# Patient Record
Sex: Male | Born: 1945 | Race: White | Hispanic: No | Marital: Married | Smoking: Never smoker
Health system: Southern US, Community
[De-identification: ages and names within clinical notes are randomized; demographics above are authoritative.]

## PROBLEM LIST (undated history)

## (undated) DIAGNOSIS — I10 Essential (primary) hypertension: Secondary | ICD-10-CM

## (undated) DIAGNOSIS — H539 Unspecified visual disturbance: Secondary | ICD-10-CM

## (undated) DIAGNOSIS — E785 Hyperlipidemia, unspecified: Secondary | ICD-10-CM

## (undated) DIAGNOSIS — C4491 Basal cell carcinoma of skin, unspecified: Secondary | ICD-10-CM

## (undated) HISTORY — DX: Essential (primary) hypertension: I10

## (undated) HISTORY — PX: JOINT REPLACEMENT: SHX530

## (undated) HISTORY — DX: Hyperlipidemia, unspecified: E78.5

## (undated) HISTORY — DX: Basal cell carcinoma of skin, unspecified: C44.91

## (undated) HISTORY — DX: Unspecified visual disturbance: H53.9

## (undated) HISTORY — PX: KNEE SURGERY: SHX244

---

## 1995-08-07 ENCOUNTER — Ambulatory Visit: Admit: 1995-08-07 | Disposition: A | Discharge status: Home / Self Care | Payer: Self-pay | Admitting: Family Medicine

## 1996-01-16 ENCOUNTER — Ambulatory Visit: Admit: 1996-01-16 | Disposition: A | Discharge status: Home / Self Care | Payer: Self-pay

## 1996-12-16 ENCOUNTER — Ambulatory Visit: Admit: 1996-12-16 | Disposition: A | Discharge status: Home / Self Care | Payer: Self-pay | Admitting: Family Medicine

## 1997-09-23 ENCOUNTER — Ambulatory Visit: Admit: 1997-09-23 | Disposition: A | Discharge status: Home / Self Care | Payer: Self-pay

## 2001-07-24 ENCOUNTER — Ambulatory Visit
Admit: 2001-07-24 | Disposition: A | Discharge status: Home / Self Care | Payer: Self-pay | Admitting: Physical Medicine & Rehabilitation

## 2002-09-12 HISTORY — PX: HX KNEE REPLACMENT: SHX125

## 2006-01-03 ENCOUNTER — Ambulatory Visit
Admission: RE | Admit: 2006-01-03 | Disposition: A | Discharge status: Home / Self Care | Payer: Self-pay | Source: Ambulatory Visit | Admitting: Family Medicine

## 2006-04-06 ENCOUNTER — Ambulatory Visit
Admit: 2006-04-06 | Disposition: A | Discharge status: Home / Self Care | Payer: Self-pay | Source: Ambulatory Visit | Admitting: Orthopaedic Surgery

## 2006-06-28 ENCOUNTER — Inpatient Hospital Stay
Admission: RE | Admit: 2006-06-28 | Disposition: A | Discharge status: Home / Self Care | Payer: Self-pay | Source: Ambulatory Visit

## 2014-06-12 DIAGNOSIS — K5792 Diverticulitis of intestine, part unspecified, without perforation or abscess without bleeding: Secondary | ICD-10-CM

## 2014-06-12 HISTORY — DX: Diverticulitis of intestine, part unspecified, without perforation or abscess without bleeding: K57.92

## 2014-06-14 ENCOUNTER — Ambulatory Visit (INDEPENDENT_AMBULATORY_CARE_PROVIDER_SITE_OTHER): Payer: Medicare Other

## 2014-06-14 ENCOUNTER — Ambulatory Visit (INDEPENDENT_AMBULATORY_CARE_PROVIDER_SITE_OTHER): Payer: Medicare Other | Admitting: Family Medicine

## 2014-06-14 VITALS — BP 150/95 | HR 91 | Temp 98.0°F | Resp 17 | Ht 69.5 in | Wt 244.0 lb

## 2014-06-14 DIAGNOSIS — I10 Essential (primary) hypertension: Secondary | ICD-10-CM

## 2014-06-14 DIAGNOSIS — R1032 Left lower quadrant pain: Secondary | ICD-10-CM

## 2014-06-14 DIAGNOSIS — K5732 Diverticulitis of large intestine without perforation or abscess without bleeding: Secondary | ICD-10-CM

## 2014-06-14 LAB — POCT CBC
Granulocyte percent: 68.6 %G (ref 37–80)
HCT, POC: 55.9 % — AB (ref 43.5–53.7)
Hemoglobin: 18.8 g/dL — AB (ref 14.1–18.1)
Lymph, poc: 2.5 (ref 0.6–3.4)
MCH, POC: 30.5 pg (ref 27–31.2)
MCHC: 33.6 g/dL (ref 31.8–35.4)
MCV: 91 fL (ref 80–97)
MID (cbc): 0.4 (ref 0–0.9)
MPV: 7.5 fL (ref 0–99.8)
POC Granulocyte: 6.4 (ref 2–6.9)
POC LYMPH PERCENT: 26.7 % (ref 10–50)
POC MID %: 4.7 %M (ref 0–12)
Platelet Count, POC: 177 10*3/uL (ref 142–424)
RBC: 6.15 M/uL — AB (ref 4.69–6.13)
RDW, POC: 13.6 %
WBC: 9.4 10*3/uL (ref 4.6–10.2)

## 2014-06-14 LAB — COMPLETE METABOLIC PANEL WITH GFR
AST: 26 U/L (ref 0–37)
Albumin: 4.3 g/dL (ref 3.5–5.2)
BUN: 13 mg/dL (ref 6–23)
CO2: 23 mEq/L (ref 19–32)
Calcium: 9.2 mg/dL (ref 8.4–10.5)
Chloride: 105 mEq/L (ref 96–112)
Creat: 1.03 mg/dL (ref 0.50–1.35)
GFR, Est African American: 86 mL/min
GFR, Est Non African American: 74 mL/min
Glucose, Bld: 83 mg/dL (ref 70–99)
Potassium: 4.2 mEq/L (ref 3.5–5.3)
Total Protein: 6.7 g/dL (ref 6.0–8.3)

## 2014-06-14 LAB — COMPLETE METABOLIC PANEL WITHOUT GFR
ALT: 43 U/L (ref 0–53)
Alkaline Phosphatase: 75 U/L (ref 39–117)
Sodium: 139 meq/L (ref 135–145)
Total Bilirubin: 0.7 mg/dL (ref 0.2–1.2)

## 2014-06-14 LAB — POCT UA - MICROSCOPIC ONLY
Bacteria, U Microscopic: NEGATIVE
Casts, Ur, LPF, POC: NEGATIVE
Crystals, Ur, HPF, POC: NEGATIVE
Mucus, UA: NEGATIVE
RBC, urine, microscopic: NEGATIVE
Yeast, UA: NEGATIVE

## 2014-06-14 LAB — POCT URINALYSIS DIPSTICK
Bilirubin, UA: NEGATIVE
Blood, UA: NEGATIVE
Glucose, UA: NEGATIVE
Ketones, UA: NEGATIVE
Leukocytes, UA: NEGATIVE
Nitrite, UA: NEGATIVE
Protein, UA: NEGATIVE
Spec Grav, UA: 1.01
Urobilinogen, UA: 0.2
pH, UA: 6

## 2014-06-14 MED ORDER — METRONIDAZOLE 500 MG PO TABS: 500.0000 mg | ORAL_TABLET | Freq: Three times a day (TID) | ORAL | Status: AC

## 2014-06-14 MED ORDER — CIPROFLOXACIN HCL 500 MG PO TABS: 500.0000 mg | ORAL_TABLET | Freq: Two times a day (BID) | ORAL | Status: AC

## 2014-06-14 NOTE — Progress Notes (Signed)
Chief Complaint:  Chief Complaint  Patient presents with  . Diverticulitis    since thursday per patient     HPI: Joseph Park is a 68 y.o. male who is here for LLQ abd and flank pain ssimilar to  Diverticulitis sxs he has had before requiring hospitalization x 3 days, he had to go to ER last year for this   Because he waited too long and had more paina and also bloody stools He is here with hsi wife from [place] for a wedding, The wedding is this afternoon, he has LLQ abd pain, denies any nausea , vomiting, fevers, chill, blood in his  Urine or stool, has been able to eat and drink without any problems, denies any h/o kidney stones, [place] or crohns He has had no abd surgeries, has has BM today and also has been passing gas, + achey pain 3-4/10   History reviewed. No pertinent past medical history. Past Surgical History  Procedure Laterality Date  . Joint replacement     History   Social History  . Marital Status: Married    Spouse Name: N/A    Number of Children: N/A  . Years of Education: N/A   Social History Main Topics  . Smoking status: Never Smoker   . Smokeless tobacco: None  . Alcohol Use: No  . Drug Use: No  . Sexual Activity: No   Other Topics Concern  . None   Social History Narrative  . None   History reviewed. No pertinent family history. No Known Allergies Prior to Admission medications   Medication Sig Start Date End Date Taking? Authorizing Provider  atenolol (TENORMIN) 25 MG tablet Take by mouth daily.   Yes Historical Provider, MD  hydrochlorothiazide (HYDRODIURIL) 25 MG tablet Take 25 mg by mouth daily.   Yes Historical Provider, MD  rosuvastatin (CRESTOR) 40 MG tablet Take 40 mg by mouth daily.   Yes Historical Provider, MD     ROS: The patient denies fevers, chills, night sweats, unintentional weight loss, chest pain, palpitations, wheezing, dyspnea on exertion, nausea, vomiting, dysuria, hematuria, melena, numbness, weakness, or tingling.    All other systems have been reviewed and were otherwise negative with the exception of those mentioned in the HPI and as above.    PHYSICAL EXAM: Filed Vitals:   06/14/14 1012  BP: 150/95  Pulse: 91  Temp: 98 F (36.7 C)  Resp: 17   Filed Vitals:   06/14/14 1012  Height: 5' 9.5" (1.765 m)  Weight: 244 lb (110.678 kg)   Body mass index is 35.53 kg/(m^2).  General: Alert, no acute distress HEENT:  Normocephalic, atraumatic, oropharynx patent. EOMI, PERRLA, tm nl  Cardiovascular:  Regular rate and rhythm, no rubs murmurs or gallops.  No Carotid bruits, radial pulse intact. No pedal edema.  Respiratory: Clear to auscultation bilaterally.  No wheezes, rales, or rhonchi.  No cyanosis, no use of accessory musculature GI: No organomegaly, + LLQ abdomen tenderness, positive bowel sounds.  No masses. Soft , no peritoneal signs, no abd hernias Skin: No rashes. Neurologic: Facial musculature symmetric. Psychiatric: Patient is appropriate throughout our interaction. Lymphatic: No cervical lymphadenopathy Musculoskeletal: Gait intact.   LABS: Results for orders placed in visit on 06/14/14  POCT CBC      Result Value Ref Range   WBC 9.4  4.6 - 10.2 K/uL   Lymph, poc 2.5  0.6 - 3.4   POC LYMPH PERCENT 26.7  10 - 50 %L  MID (cbc) 0.4  0 - 0.9   POC MID % 4.7  0 - 12 %M   POC Granulocyte 6.4  2 - 6.9   Granulocyte percent 68.6  37 - 80 %G   RBC 6.15 (*) 4.69 - 6.13 M/uL   Hemoglobin 18.8 (*) 14.1 - 18.1 g/dL   HCT, POC 16.155.9 (*) 09.643.5 - 53.7 %   MCV 91.0  80 - 97 fL   MCH, POC 30.5  27 - 31.2 pg   MCHC 33.6  31.8 - 35.4 g/dL   RDW, POC 04.513.6     Platelet Count, POC 177  142 - 424 K/uL   MPV 7.5  0 - 99.8 fL  POCT URINALYSIS DIPSTICK      Result Value Ref Range   Color, UA yellow     Clarity, UA clear     Glucose, UA neg     Bilirubin, UA neg     Ketones, UA neg     Spec Grav, UA 1.010     Blood, UA neg     pH, UA 6.0     Protein, UA neg     Urobilinogen, UA 0.2      Nitrite, UA neg     Leukocytes, UA Negative    POCT UA - MICROSCOPIC ONLY      Result Value Ref Range   WBC, Ur, HPF, POC 0-2     RBC, urine, microscopic neg     Bacteria, U Microscopic neg     Mucus, UA neg     Epithelial cells, urine per micros 0-1     Crystals, Ur, HPF, POC neg     Casts, Ur, LPF, POC neg     Yeast, UA neg       EKG/XRAY:   [facility] Neg for acute cardiopulmonary issues, perhaps mild chronic bronchitis No effusion Abdomen no free air, or obvious obstruction   ASSESSMENT/PLAN: Encounter Diagnoses  Name Primary?  . Left lower quadrant pain   . Essential hypertension   . Diverticulitis of colon without hemorrhage Yes   Rx Flagyl TID and Cipro BID x 10 days Bland , liquid diet advance as tolerated F/u with PCP prn   Gross sideeffects, risk and benefits, and alternatives of medications d/w patient. Patient is aware that all medications have potential sideeffects and we are unable to predict every sideeffect or drug-drug interaction that may occur.  Shailen Thielen PHUONG, DO 06/14/2014 12:12 PM

## 2014-06-14 NOTE — Patient Instructions (Signed)
Diverticulosis Diverticulosis is the condition that develops when small pouches (diverticula) form in the wall of your colon. Your colon, or large intestine, is where water is absorbed and stool is formed. The pouches form when the inside layer of your colon pushes through weak spots in the outer layers of your colon. CAUSES  No one knows exactly what causes diverticulosis. RISK FACTORS  Being older than 50. Your risk for this condition increases with age. Diverticulosis is rare in people younger than 40 years. By age 80, almost everyone has it.  Eating a low-fiber diet.  Being frequently constipated.  Being overweight.  Not getting enough exercise.  Smoking.  Taking over-the-counter pain medicines, like aspirin and ibuprofen. SYMPTOMS  Most people with diverticulosis do not have symptoms. DIAGNOSIS  Because diverticulosis often has no symptoms, health care providers often discover the condition during an exam for other colon problems. In many cases, a health care provider will diagnose diverticulosis while using a flexible scope to examine the colon (colonoscopy). TREATMENT  If you have never developed an infection related to diverticulosis, you may not need treatment. If you have had an infection before, treatment may include:  Eating more fruits, vegetables, and grains.  Taking a fiber supplement.  Taking a live bacteria supplement (probiotic).  Taking medicine to relax your colon. HOME CARE INSTRUCTIONS   Drink at least 6-8 glasses of water each day to prevent constipation.  Try not to strain when you have a bowel movement.  Keep all follow-up appointments. If you have had an infection before:  Increase the fiber in your diet as directed by your health care provider or dietitian.  Take a dietary fiber supplement if your health care provider approves.  Only take medicines as directed by your health care provider. SEEK MEDICAL CARE IF:   You have abdominal  pain.  You have bloating.  You have cramps.  You have not gone to the bathroom in 3 days. SEEK IMMEDIATE MEDICAL CARE IF:   Your pain gets worse.  Yourbloating becomes very bad.  You have a fever or chills, and your symptoms suddenly get worse.  You begin vomiting.  You have bowel movements that are bloody or black. MAKE SURE YOU:  Understand these instructions.  Will watch your condition.  Will get help right away if you are not doing well or get worse. Document Released: 05/26/2004 Document Revised: 09/03/2013 Document Reviewed: 07/24/2013 Exit[employer] Patient Information 2015 [employer]. This information is not intended to replace advice given to you by your health care provider. Make sure you discuss any questions you have with your health care provider. Diverticulitis Diverticulitis is inflammation or infection of small pouches in your colon that form when you have a condition called diverticulosis. The pouches in your colon are called diverticula. Your colon, or large intestine, is where water is absorbed and stool is formed. Complications of diverticulitis can include:  Bleeding.  Severe infection.  Severe pain.  Perforation of your colon.  Obstruction of your colon. CAUSES  Diverticulitis is caused by bacteria. Diverticulitis happens when stool becomes trapped in diverticula. This allows bacteria to grow in the diverticula, which can lead to inflammation and infection. RISK FACTORS People with diverticulosis are at risk for diverticulitis. Eating a diet that does not include enough fiber from fruits and vegetables may make diverticulitis more likely to develop. SYMPTOMS  Symptoms of diverticulitis may include:  Abdominal pain and tenderness. The pain is normally located on the left side of the abdomen, but   may occur in other areas.  Fever and chills.  Bloating.  Cramping.  Nausea.  Vomiting.  Constipation.  Diarrhea.  Blood in your  stool. DIAGNOSIS  Your health care provider will ask you about your medical history and do a physical exam. You may need to have tests done because many medical conditions can cause the same symptoms as diverticulitis. Tests may include:  Blood tests.  Urine tests.  Imaging tests of the abdomen, including X-rays and CT scans. When your condition is under control, your health care provider may recommend that you have a colonoscopy. A colonoscopy can show how severe your diverticula are and whether something else is causing your symptoms. TREATMENT  Most cases of diverticulitis are mild and can be treated at home. Treatment may include:  Taking over-the-counter pain medicines.  Following a clear liquid diet.  Taking antibiotic medicines by mouth for 7-10 days. More severe cases may be treated at a hospital. Treatment may include:  Not eating or drinking.  Taking prescription pain medicine.  Receiving antibiotic medicines through an IV tube.  Receiving fluids and nutrition through an IV tube.  Surgery. HOME CARE INSTRUCTIONS   Follow your health care provider's instructions carefully.  Follow a full liquid diet or other diet as directed by your health care provider. After your symptoms improve, your health care provider may tell you to change your diet. He or she may recommend you eat a high-fiber diet. Fruits and vegetables are good sources of fiber. Fiber makes it easier to pass stool.  Take fiber supplements or probiotics as directed by your health care provider.  Only take medicines as directed by your health care provider.  Keep all your follow-up appointments. SEEK MEDICAL CARE IF:   Your pain does not improve.  You have a hard time eating food.  Your bowel movements do not return to normal. SEEK IMMEDIATE MEDICAL CARE IF:   Your pain becomes worse.  Your symptoms do not get better.  Your symptoms suddenly get worse.  You have a fever.  You have repeated  vomiting.  You have bloody or black, tarry stools. MAKE SURE YOU:   Understand these instructions.  Will watch your condition.  Will get help right away if you are not doing well or get worse. Document Released: 06/08/2005 Document Revised: 09/03/2013 Document Reviewed: 07/24/2013 ExitCare Patient Information 2015 [employer]. This information is not intended to replace advice given to you by your health care provider. Make sure you discuss any questions you have with your health care provider.  

## 2014-06-17 ENCOUNTER — Encounter: Payer: Self-pay | Admitting: Family Medicine

## 2015-07-01 ENCOUNTER — Telehealth: Payer: Self-pay

## 2015-07-02 ENCOUNTER — Ambulatory Visit: Admitting: Ophthalmology

## 2015-07-02 ENCOUNTER — Ambulatory Visit: Payer: Non-veteran care | Admitting: Anesthesiology

## 2015-07-02 ENCOUNTER — Ambulatory Visit
Admission: RE | Admit: 2015-07-02 | Discharge: 2015-07-02 | Disposition: A | Discharge status: Home / Self Care | Payer: Non-veteran care | Attending: Ophthalmology | Admitting: Ophthalmology

## 2015-07-02 ENCOUNTER — Encounter
Admission: RE | Disposition: A | Discharge status: Home / Self Care | Payer: Self-pay | Source: Home / Self Care | Attending: Ophthalmology

## 2015-07-02 DIAGNOSIS — I739 Peripheral vascular disease, unspecified: Secondary | ICD-10-CM | POA: Insufficient documentation

## 2015-07-02 DIAGNOSIS — H269 Unspecified cataract: Secondary | ICD-10-CM | POA: Insufficient documentation

## 2015-07-02 DIAGNOSIS — H348312 Tributary (branch) retinal vein occlusion, right eye, stable: Secondary | ICD-10-CM | POA: Insufficient documentation

## 2015-07-02 DIAGNOSIS — Z8249 Family history of ischemic heart disease and other diseases of the circulatory system: Secondary | ICD-10-CM | POA: Insufficient documentation

## 2015-07-02 DIAGNOSIS — H43811 Vitreous degeneration, right eye: Secondary | ICD-10-CM | POA: Insufficient documentation

## 2015-07-02 DIAGNOSIS — H35371 Puckering of macula, right eye: Secondary | ICD-10-CM | POA: Insufficient documentation

## 2015-07-02 DIAGNOSIS — H35033 Hypertensive retinopathy, bilateral: Secondary | ICD-10-CM | POA: Insufficient documentation

## 2015-07-02 DIAGNOSIS — H40013 Open angle with borderline findings, low risk, bilateral: Secondary | ICD-10-CM | POA: Insufficient documentation

## 2015-07-02 HISTORY — PX: VITRECTOMY, 25 GAUGE, MECHANICAL: SHX5707

## 2015-07-02 SURGERY — VITRECTOMY 25 GAUGE, MECHANICAL, PARS PLANA
Anesthesia: Anesthesia General | Site: Eye | Laterality: Right | Wound class: Clean

## 2015-07-02 MED ORDER — HYPROMELLOSE (GONIOSCOPIC) 2.5 % OP SOLN
OPHTHALMIC | Status: AC
Start: 2015-07-02 — End: ?
  Filled 2015-07-02: qty 15

## 2015-07-02 MED ORDER — MIDAZOLAM HCL 2 MG/2ML IJ SOLN
INTRAMUSCULAR | Status: DC | PRN
Start: 2015-07-02 — End: 2015-07-02
  Administered 2015-07-02 (×2): 1 mg via INTRAVENOUS

## 2015-07-02 MED ORDER — VH PROPOFOL INFUSION 10 MG/ML (WRAPPED)
INTRAVENOUS | Status: DC | PRN
Start: 2015-07-02 — End: 2015-07-02
  Administered 2015-07-02: 50 ug/kg/min via INTRAVENOUS

## 2015-07-02 MED ORDER — MOXIFLOXACIN HCL 0.5 % OP SOLN
1.0000 [drp] | Freq: Once | OPHTHALMIC | Status: AC
Start: 2015-07-02 — End: 2015-07-02
  Administered 2015-07-02: 1 [drp] via OPHTHALMIC
  Filled 2015-07-02: qty 3

## 2015-07-02 MED ORDER — LACTATED RINGERS IV SOLN
INTRAVENOUS | Status: DC
Start: 2015-07-02 — End: 2015-07-02

## 2015-07-02 MED ORDER — METHYLPREDNISOLONE ACETATE 40 MG/ML IJ SUSP
INTRAMUSCULAR | Status: DC | PRN
Start: 2015-07-02 — End: 2015-07-02
  Administered 2015-07-02: 40 mg

## 2015-07-02 MED ORDER — PROPOFOL 10 MG/ML IV EMUL (WRAP)
INTRAVENOUS | Status: DC | PRN
Start: 2015-07-02 — End: 2015-07-02
  Administered 2015-07-02: 20 mg via INTRAVENOUS
  Administered 2015-07-02: 30 mg via INTRAVENOUS
  Administered 2015-07-02: 20 mg via INTRAVENOUS

## 2015-07-02 MED ORDER — BUPIVACAINE HCL (PF) 0.75 % IJ SOLN
INTRAMUSCULAR | Status: DC | PRN
Start: 2015-07-02 — End: 2015-07-02
  Administered 2015-07-02: 5 mL via RETROBULBAR

## 2015-07-02 MED ORDER — EPINEPHRINE 1 MG/ML IJ SOLN
INTRAMUSCULAR | Status: AC
Start: 2015-07-02 — End: ?
  Filled 2015-07-02: qty 1

## 2015-07-02 MED ORDER — INDOCYANINE GREEN 25 MG IV SOLR
INTRAVENOUS | Status: DC | PRN
Start: 2015-07-02 — End: 2015-07-02
  Administered 2015-07-02: 25 mg via INTRAVENOUS

## 2015-07-02 MED ORDER — INDOCYANINE GREEN 25 MG IV SOLR
INTRAVENOUS | Status: AC
Start: 2015-07-02 — End: ?
  Filled 2015-07-02: qty 25

## 2015-07-02 MED ORDER — STERILE WATER FOR INJECTION IJ SOLN
INTRAMUSCULAR | Status: AC
Start: 2015-07-02 — End: ?
  Filled 2015-07-02: qty 10

## 2015-07-02 MED ORDER — BUPIVACAINE HCL (PF) 0.75 % IJ SOLN
INTRAMUSCULAR | Status: AC
Start: 2015-07-02 — End: ?
  Filled 2015-07-02: qty 10

## 2015-07-02 MED ORDER — STERILE WATER FOR INJECTION IJ SOLN
INTRAMUSCULAR | Status: DC | PRN
Start: 2015-07-02 — End: 2015-07-02
  Administered 2015-07-02: 5 mL

## 2015-07-02 MED ORDER — TOBRAMYCIN 0.3 % OP OINT
TOPICAL_OINTMENT | OPHTHALMIC | Status: DC | PRN
Start: 2015-07-02 — End: 2015-07-02
  Administered 2015-07-02: 1 g via OPHTHALMIC

## 2015-07-02 MED ORDER — MIDAZOLAM HCL 2 MG/2ML IJ SOLN
INTRAMUSCULAR | Status: AC
Start: 2015-07-02 — End: ?
  Filled 2015-07-02: qty 2

## 2015-07-02 MED ORDER — SODIUM CHLORIDE 0.9 % IV SOLN
INTRAVENOUS | Status: DC | PRN
Start: 2015-07-02 — End: 2015-07-02

## 2015-07-02 MED ORDER — CEFAZOLIN SODIUM 1 G IJ SOLR
INTRAMUSCULAR | Status: AC
Start: 2015-07-02 — End: ?
  Filled 2015-07-02: qty 1000

## 2015-07-02 MED ORDER — FENTANYL CITRATE (PF) 50 MCG/ML IJ SOLN (WRAP)
INTRAMUSCULAR | Status: DC | PRN
Start: 2015-07-02 — End: 2015-07-02
  Administered 2015-07-02: 50 ug via INTRAVENOUS

## 2015-07-02 MED ORDER — PROPOFOL 10 MG/ML IV EMUL (WRAP)
INTRAVENOUS | Status: AC
Start: 2015-07-02 — End: ?
  Filled 2015-07-02: qty 20

## 2015-07-02 MED ORDER — DEXTROSE 50 % IV SOLN
INTRAVENOUS | Status: AC
Start: 2015-07-02 — End: ?
  Filled 2015-07-02: qty 50

## 2015-07-02 MED ORDER — METHYLPREDNISOLONE ACETATE 40 MG/ML IJ SUSP
INTRAMUSCULAR | Status: AC
Start: 2015-07-02 — End: ?
  Filled 2015-07-02: qty 1

## 2015-07-02 MED ORDER — VH PHENYLEPHRINE 120 MCG/ML IV BOLUS (ANESTHESIA)
PREFILLED_SYRINGE | INTRAVENOUS | Status: AC
Start: 2015-07-02 — End: ?
  Filled 2015-07-02: qty 20

## 2015-07-02 MED ORDER — BSS IO SOLN
INTRAOCULAR | Status: DC | PRN
Start: 2015-07-02 — End: 2015-07-02
  Administered 2015-07-02: 15 mL via TOPICAL

## 2015-07-02 MED ORDER — TRIAMCINOLONE ACETONIDE 40 MG/ML IO SUSP
INTRAOCULAR | Status: AC
Start: 2015-07-02 — End: ?
  Filled 2015-07-02: qty 1

## 2015-07-02 MED ORDER — FENTANYL CITRATE (PF) 50 MCG/ML IJ SOLN (WRAP)
INTRAMUSCULAR | Status: AC
Start: 2015-07-02 — End: ?
  Filled 2015-07-02: qty 2

## 2015-07-02 MED ORDER — BSS PLUS IO SOLN
INTRAOCULAR | Status: DC | PRN
Start: 2015-07-02 — End: 2015-07-02
  Administered 2015-07-02: 500 mL via INTRAOCULAR

## 2015-07-02 MED ORDER — PLASMA-LYTE 148 IV SOLN
INTRAVENOUS | Status: DC | PRN
Start: 2015-07-02 — End: 2015-07-02
  Administered 2015-07-02: 500 mL

## 2015-07-02 MED ORDER — LIDOCAINE HCL (PF) 2 % IJ SOLN
INTRAMUSCULAR | Status: AC
Start: 2015-07-02 — End: ?
  Filled 2015-07-02: qty 10

## 2015-07-02 MED ORDER — ROCURONIUM BROMIDE 50 MG/5ML IV SOLN
INTRAVENOUS | Status: AC
Start: 2015-07-02 — End: ?
  Filled 2015-07-02: qty 5

## 2015-07-02 MED ORDER — BSS PLUS IO SOLN
INTRAOCULAR | Status: AC
Start: 2015-07-02 — End: ?
  Filled 2015-07-02: qty 500

## 2015-07-02 MED ORDER — TOBRAMYCIN 0.3 % OP OINT
TOPICAL_OINTMENT | OPHTHALMIC | Status: AC
Start: 2015-07-02 — End: ?
  Filled 2015-07-02: qty 3.5

## 2015-07-02 MED ORDER — BSS IO SOLN
INTRAOCULAR | Status: AC
Start: 2015-07-02 — End: ?
  Filled 2015-07-02: qty 15

## 2015-07-02 MED ORDER — PROPARACAINE HCL 0.5 % OP SOLN
1.0000 [drp] | Freq: Once | OPHTHALMIC | Status: AC
Start: 2015-07-02 — End: 2015-07-02
  Administered 2015-07-02: 1 [drp] via OPHTHALMIC
  Filled 2015-07-02: qty 15

## 2015-07-02 MED ORDER — HYPROMELLOSE (GONIOSCOPIC) 2.5 % OP SOLN
OPHTHALMIC | Status: DC | PRN
Start: 2015-07-02 — End: 2015-07-02
  Administered 2015-07-02: 5 mL via TOPICAL

## 2015-07-02 MED ORDER — PHENYLEPHRINE HCL 2.5 % OP SOLN
1.0000 [drp] | OPHTHALMIC | Status: AC
Start: 2015-07-02 — End: 2015-07-02
  Administered 2015-07-02 (×3): 1 [drp] via OPHTHALMIC
  Filled 2015-07-02: qty 2

## 2015-07-02 MED ORDER — CYCLOPENTOLATE HCL 1 % OP SOLN
1.0000 [drp] | OPHTHALMIC | Status: AC
Start: 2015-07-02 — End: 2015-07-02
  Administered 2015-07-02 (×3): 1 [drp] via OPHTHALMIC
  Filled 2015-07-02: qty 2

## 2015-07-02 MED ORDER — EPINEPHRINE HCL 0.1 MG/ML IJ SOSY
PREFILLED_SYRINGE | INTRAMUSCULAR | Status: DC | PRN
Start: 2015-07-02 — End: 2015-07-02
  Administered 2015-07-02: .3 mg

## 2015-07-02 SURGICAL SUPPLY — 16 items
CLEANER ANTI-FOG (Supply) ×2 IMPLANT
DRAPE U 76X120 ST  *FOR IYLAS* (Supply) ×2 IMPLANT
FORCEP ILM 25+ DSP TIP (Supply) ×1 IMPLANT
GLOVE BIOGEL UT PI SURG SZ 7.5 (Supply) ×4 IMPLANT
GOWN STD LG W/TOWEL REUSE (Supply) ×4 IMPLANT
LENS ASPHERIC MACULAR DSP-59D (Supply) ×1 IMPLANT
PACK RETINAL CDS810023I CS/3 (Supply) ×2 IMPLANT
PAD ARMBOARD 20 X 8 (Supply) ×2 IMPLANT
PAK VITRECTOMY KIT (NEW ONE) (Supply) ×2 IMPLANT
PROBE LASER 25GA FLEXIBLE TIP (Supply) IMPLANT
SCRAPER MEMBRANE CVD 25+ (Supply) ×1 IMPLANT
SPONGE GAUZE 4 X 4 12 PLY (Dressings) ×2 IMPLANT
STERISTRIP 1/2 IN X 4 IN (Supply) ×2 IMPLANT
SUT VICRYL 7-0 J576G (Supply) ×1 IMPLANT
TAPE MICROPORE 1 (PAPER) (Supply) ×2 IMPLANT
TOWEL (FAN-FOLD) 6/PK (Supply) ×4 IMPLANT

## 2015-07-02 NOTE — Discharge Instructions (Signed)
[facility]   Post-Operative Discharge Instruction    Please bring this instruction sheet and any eyedrops from the hospital with you to the office for your next post-operative visit.  Keep the patch overtop of the eye until you are examined tomorrow (DO NOT REMOVE). Continue all other home medications unless told otherwise.  Call the office if you have any questions or problems with the eye.    Follow up: You will see Dr. Carin Primrose tomorrow in the office as previously scheduled.    Symptoms of Concern:  If the following symptoms develop, please contact the office immediately: worsening of eye pain, severe headache, vomiting, or decreasing vision.    Medications: No eye drops will be used till AFTER your eye patch has been removed by Dr. Carin Primrose the next day. You can take Tylenol or Ibuprofen over the counter as needed for pain (as long as you do not have an allergy to these- contact Dr. Carin Primrose if you do)         Positioning:  Face Down for 3 FULL DAYS starting today    Arm Band: If you received a green arm band (indicates that you have Gas in the operated eye) at the time of surgery, please do not remove it until instructed by your physician. This is a warning to healthcare providers who may come across you in the coming weeks.    Activities:    Avoid strenuous activities: heavy lifting, straining, sudden movement till told otherwise.   You may watch TV but avoid reading in the first week.  You may shower or shampoo beginning 1 FULL DAY after surgery. Avoid getting water directly in the eye.  Dr. Carin Primrose will discuss with you the general plan for returning back to work and getting back to full activity levels.

## 2015-07-02 NOTE — H&P (Signed)
I have reviewed the H&P, examined the patient and there are no changes.

## 2015-07-02 NOTE — Anesthesia Postprocedure Evaluation (Signed)
Anesthesia Post Evaluation    Patient: Alan Hoover    Procedures performed: Procedure(s) with comments:  VITRECTOMY, 25 GAUGE, MECHANICAL - RIGHT PPV MP/ILM PEEL AIR OR GAS     Anesthesia type: MAC       Last vitals:   Filed Vitals:    07/02/15 1506   BP: 136/85   Pulse: 68   Temp: 36.9 C (98.4 F)   Resp: 16   SpO2: 97%       Patient discharged from [facility] after meeting discharge criteria.

## 2015-07-02 NOTE — Transfer of Care (Signed)
Anesthesia Transfer of Care Note    Patient: Alan Hoover    Last vitals:   Filed Vitals:    07/02/15 1054   BP: 149/87   Pulse: 86   Temp: 36.5 C (97.7 F)   Resp: 16   SpO2: 98%       Oxygen: Room Air     Mental Status:awake and alert     Airway: Natural    Cardiovascular Status:  stable

## 2015-07-02 NOTE — Anesthesia Preprocedure Evaluation (Signed)
Anesthesia Evaluation    AIRWAY    Mallampati: II    TM distance: >3 FB  Neck ROM: full  Mouth Opening:full   CARDIOVASCULAR    cardiovascular exam normal, regular and normal       DENTAL    no notable dental hx               PULMONARY    pulmonary exam normal and clear to auscultation     OTHER FINDINGS                                              Anesthesia Plan    ASA 2     general                     intravenous induction   Detailed anesthesia plan: general endotracheal        Post op pain management: per surgeon    informed consent obtained      pertinent labs reviewed

## 2015-07-02 NOTE — Op Note (Signed)
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 Risks, benefits, alternatives were discussed with the patient. In the preoperative holding area the operative eye was marked. The patient was then brought back to the operating room and placed under monitored anesthesia care.  The patient  received a retrobulbar block of .75% Marcaine and 2% xylocaine. The operative eye was then prepped and draped in the usual sterile fashion for ophthalmic surgery.    The microscope was brought into position. Standard 25 gauge instrumentation was used to create trocar-based sclerotomies posterior to the limbus. The infusion cannula trocar was verified to be in the vitreous cavity prior to attaching and turning on the infusion line. A core vitrectomy was performed.  A posterior vitreous detachment was already present. Vitrectomy was carried out 360 degrees and vitreous base was trimmed. Examination of the macula revealed a strong macular pucker with definite foveal distortion.    ICG dye in an appropriate dilution was instilled overtop the macula and allowed to sit for one minute. The excess was aspirated.  Under the macula Resight lens, the Membrane Scraper was used to create an edge of Internal Limiting Membrane in temporal macula. The 25 Gauge ILM forceps was then used to peel the internal limiting  membrane and overlying Epiretinal Membrane atraumatically. A nice region of epiretinal membrane clearance around the fovea was obtained. There were no iatrogenic breaks during the peel and the macula appeared much more relaxed and without traction.    The retina was inspected 360 degrees and found to have no tears or holes. An Air-Fluid Exchange was now performed. The operative eye was felt to have a moderate tactile tension. The trocars were pulled one at a time starting with the superior ones. Those that self-sealed were left alone.  Any leaking wounds were sealed with a 7-0 Vicryl suture. A subconjunctival injection of ancef and dexamethasone was given. The eye was patched over Tobradex ointment The patient tolerated the procedure well and was returned to the recovery room in excellent condition.  The patient received post operative instructions from Dr. Carin Primrose.  Estimated Blood Loss:   0 mL      Specimens:   none    Complications:   * No complications entered in OR log *      Signed by: Clarene Duke, MD

## 2015-07-06 ENCOUNTER — Encounter: Payer: Self-pay | Admitting: Ophthalmology

## 2016-04-13 IMAGING — CR DG ABDOMEN ACUTE W/ 1V CHEST
3 series · 3 of 3 positions shown · non-contrast
Comparison: None.

CLINICAL DATA: Left lower quadrant abdominal pain. History of
diverticulitis. Initial encounter.

EXAM:
ACUTE ABDOMEN SERIES (ABDOMEN 2 VIEW & CHEST 1 VIEW)

[PA]
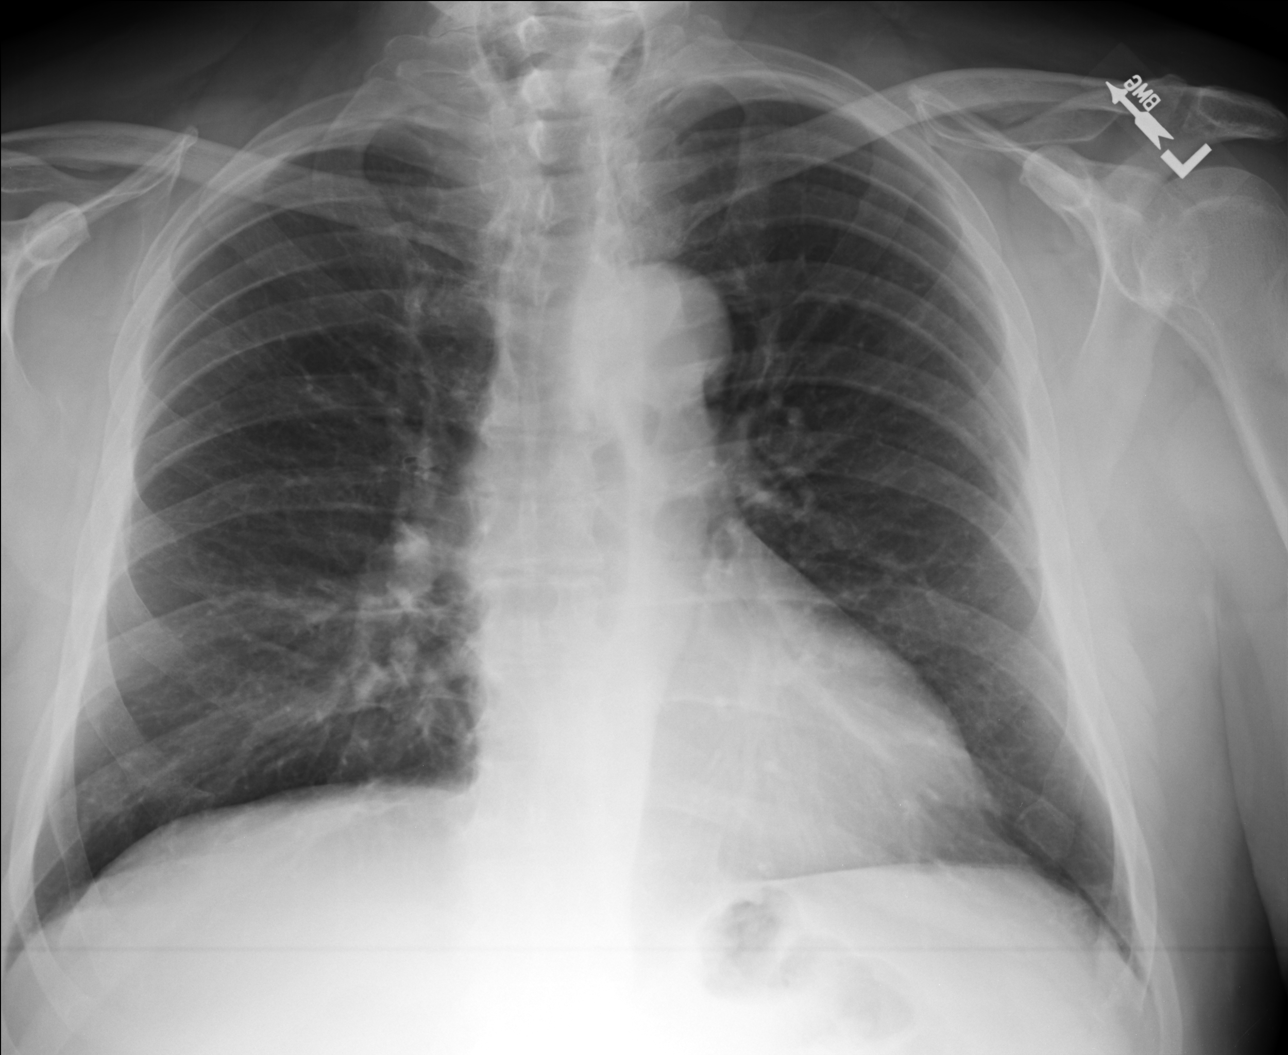

[AP (1 of 2)]
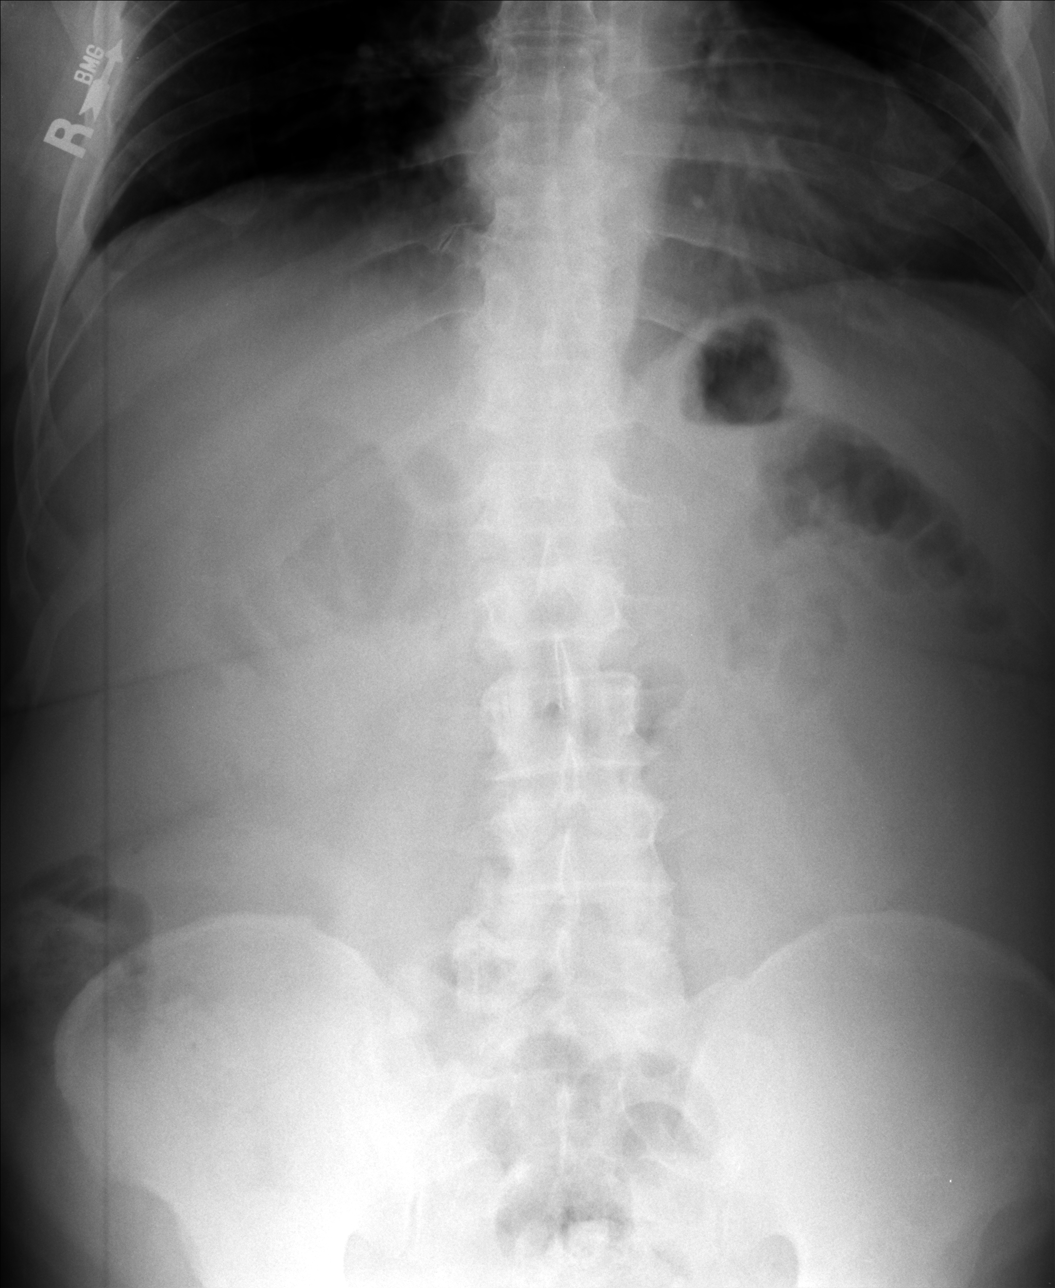

[AP (2 of 2)]
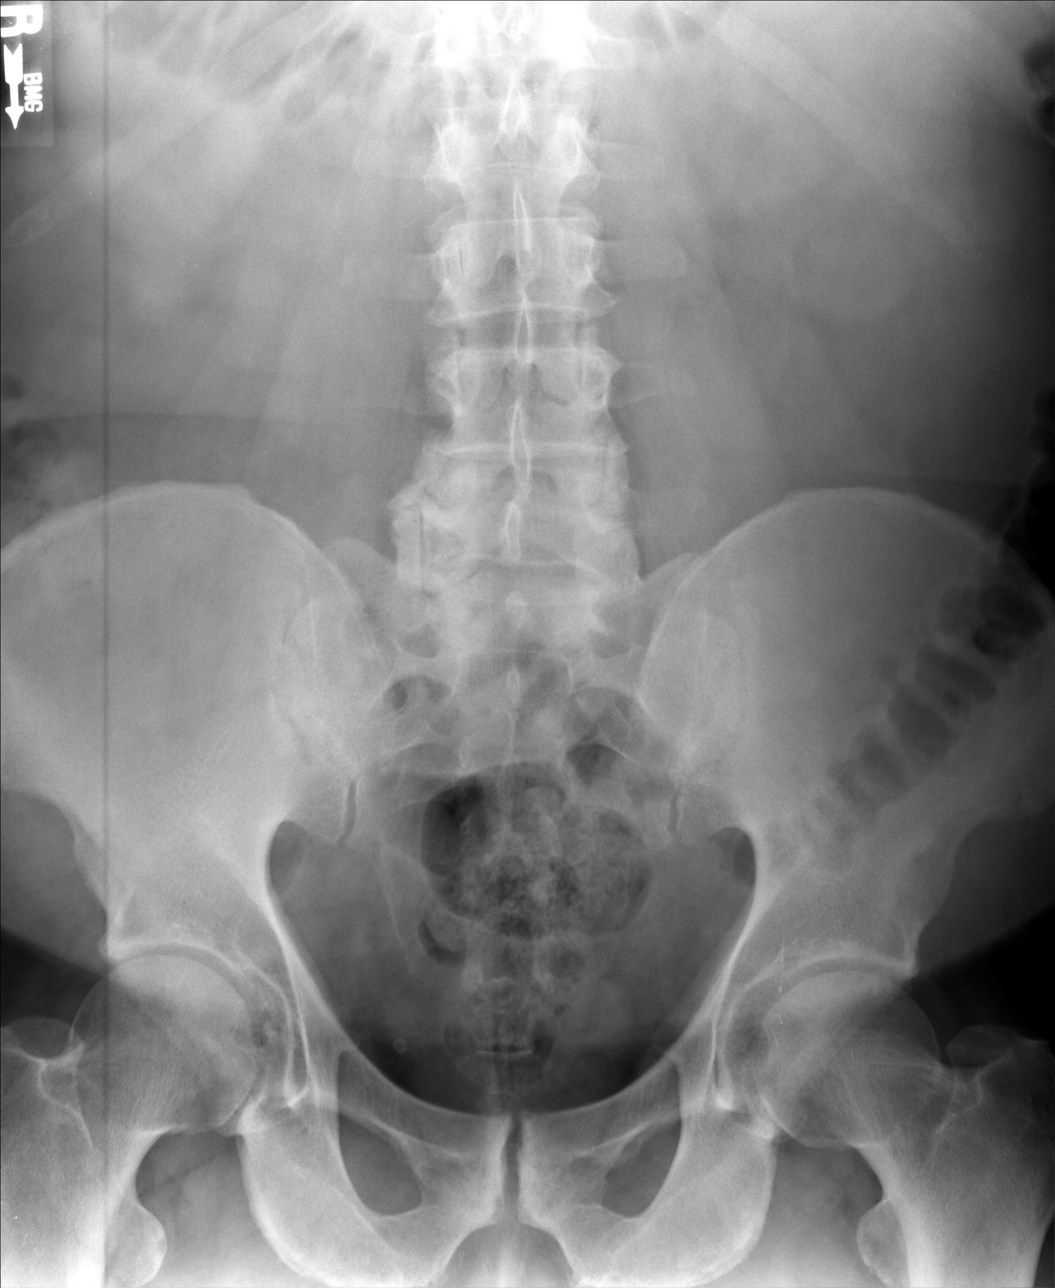

[3 of 3 positions shown; findings below may reference images not displayed]

FINDINGS: Borderline enlarged cardiac silhouette and mediastinal contours.
There is mild slightly nodular thickening of the pulmonary
interstitium. No discrete focal airspace opacities. No pleural
effusion or pneumothorax. No evidence of edema. No acute osseus
abnormalities.

Paucity of bowel gas without definite evidence of obstruction. No
pneumoperitoneum, pneumatosis or portal venous gas.

A prominent phlebolith overlies the right lower pelvis. Otherwise,
no definite abnormal intra-abdominal calcifications.

No acute osseus abnormalities. Bilateral facet degenerative change
of the lower lumbar spine is suspected though incompletely
evaluated.
IMPRESSION: 1. Borderline cardiomegaly and bronchitic change without acute
cardiopulmonary disease.
2. Paucity of bowel gas without evidence of obstruction.

## 2020-04-20 ENCOUNTER — Ambulatory Visit
Admission: RE | Admit: 2020-04-20 | Discharge: 2020-04-20 | Disposition: A | Discharge status: Home / Self Care | Payer: Self-pay | Source: Ambulatory Visit | Attending: Diagnostic Radiology | Admitting: Diagnostic Radiology

## 2020-04-20 ENCOUNTER — Encounter: Payer: Self-pay | Admitting: Diagnostic Radiology

## 2020-04-21 ENCOUNTER — Telehealth: Payer: Self-pay

## 2020-04-21 NOTE — Telephone Encounter (Signed)
[facility] C[facility] Request.

## 2020-04-23 ENCOUNTER — Encounter: Payer: Self-pay | Admitting: Orthopaedic Surgery

## 2020-04-23 DIAGNOSIS — M7121 Synovial cyst of popliteal space [Baker], right knee: Secondary | ICD-10-CM

## 2020-05-01 ENCOUNTER — Ambulatory Visit
Admission: RE | Admit: 2020-05-01 | Discharge: 2020-05-01 | Disposition: A | Discharge status: Home / Self Care | Payer: Non-veteran care | Source: Ambulatory Visit | Attending: Orthopaedic Surgery | Admitting: Orthopaedic Surgery

## 2020-05-01 DIAGNOSIS — M7121 Synovial cyst of popliteal space [Baker], right knee: Secondary | ICD-10-CM | POA: Insufficient documentation

## 2020-05-01 MED ORDER — LIDOCAINE HCL 1 % IJ SOLN
3.0000 mL | Freq: Once | INTRAMUSCULAR | Status: AC
Start: 2020-05-01 — End: 2020-05-01
  Administered 2020-05-01: 3 mL
  Filled 2020-05-01: qty 3

## 2020-05-01 MED ORDER — METHYLPREDNISOLONE ACETATE 40 MG/ML IJ SUSP
INTRAMUSCULAR | Status: AC
Start: 2020-05-01 — End: ?
  Filled 2020-05-01: qty 1

## 2020-05-01 MED ORDER — LIDOCAINE HCL (PF) 1 % IJ SOLN
INTRAMUSCULAR | Status: AC
Start: 2020-05-01 — End: ?
  Filled 2020-05-01: qty 5

## 2020-05-01 MED ORDER — METHYLPREDNISOLONE ACETATE 40 MG/ML IJ SUSP
40.0000 mg | Freq: Once | INTRAMUSCULAR | Status: AC
Start: 2020-05-01 — End: 2020-05-01
  Administered 2020-05-01: 40 mg via INTRA_ARTICULAR

## 2020-06-29 ENCOUNTER — Encounter: Payer: Self-pay | Admitting: Orthopaedic Surgery

## 2020-06-29 DIAGNOSIS — M7121 Synovial cyst of popliteal space [Baker], right knee: Secondary | ICD-10-CM

## 2020-07-03 ENCOUNTER — Ambulatory Visit
Admission: RE | Admit: 2020-07-03 | Discharge: 2020-07-03 | Disposition: A | Discharge status: Home / Self Care | Payer: Non-veteran care | Source: Ambulatory Visit | Attending: Orthopaedic Surgery | Admitting: Orthopaedic Surgery

## 2020-07-03 DIAGNOSIS — M7121 Synovial cyst of popliteal space [Baker], right knee: Secondary | ICD-10-CM | POA: Insufficient documentation

## 2020-07-03 MED ORDER — METHYLPREDNISOLONE ACETATE 40 MG/ML IJ SUSP
40.0000 mg | Freq: Once | INTRAMUSCULAR | Status: AC
Start: 2020-07-03 — End: 2020-07-03
  Administered 2020-07-03: 40 mg via INTRA_ARTICULAR

## 2020-07-03 MED ORDER — LIDOCAINE HCL 1 % IJ SOLN
3.0000 mL | Freq: Once | INTRAMUSCULAR | Status: AC
Start: 2020-07-03 — End: 2020-07-03
  Administered 2020-07-03: 3 mL
  Filled 2020-07-03: qty 3

## 2020-07-03 MED ORDER — METHYLPREDNISOLONE ACETATE 40 MG/ML IJ SUSP
INTRAMUSCULAR | Status: AC
Start: 2020-07-03 — End: ?
  Filled 2020-07-03: qty 1

## 2020-07-06 NOTE — Progress Notes (Signed)
How are you feeling? "I am doing good"      Was your pain controlled during your procedure? "yes"  Pain control after your procedure? "no way, my doctor prescribed me some anti inflammatories and that has helped alot  Pain currently? "no not at all"      Do you have any unusual bleeding/bruising? "no "      Were your discharge instructions given to you clearly? "yes they were"    Any additional comments? - All comments made during post call with pt

## 2020-08-31 ENCOUNTER — Encounter: Payer: Self-pay | Admitting: Orthopaedic Surgery

## 2020-08-31 DIAGNOSIS — M7121 Synovial cyst of popliteal space [Baker], right knee: Secondary | ICD-10-CM

## 2020-09-03 ENCOUNTER — Ambulatory Visit: Admission: RE | Admit: 2020-09-03 | Payer: Non-veteran care | Source: Ambulatory Visit

## 2020-09-03 ENCOUNTER — Ambulatory Visit
Admission: RE | Admit: 2020-09-03 | Discharge: 2020-09-03 | Disposition: A | Discharge status: Home / Self Care | Payer: Non-veteran care | Source: Ambulatory Visit | Attending: Orthopaedic Surgery | Admitting: Orthopaedic Surgery

## 2020-09-03 ENCOUNTER — Encounter: Payer: Self-pay | Admitting: Orthopaedic Surgery

## 2020-09-03 DIAGNOSIS — M7121 Synovial cyst of popliteal space [Baker], right knee: Secondary | ICD-10-CM | POA: Insufficient documentation

## 2020-09-03 DIAGNOSIS — M7120 Synovial cyst of popliteal space [Baker], unspecified knee: Secondary | ICD-10-CM

## 2020-09-03 MED ORDER — METHYLPREDNISOLONE ACETATE 40 MG/ML IJ SUSP
INTRAMUSCULAR | Status: AC
Start: 2020-09-03 — End: ?
  Filled 2020-09-03: qty 1

## 2020-09-03 MED ORDER — METHYLPREDNISOLONE ACETATE 40 MG/ML IJ SUSP
40.0000 mg | Freq: Once | INTRAMUSCULAR | Status: AC
Start: 2020-09-03 — End: 2020-09-03
  Administered 2020-09-03: 40 mg via INTRA_ARTICULAR

## 2020-09-03 MED ORDER — ROPIVACAINE HCL 5 MG/ML IJ SOLN
INTRAMUSCULAR | Status: AC
Start: 2020-09-03 — End: ?
  Filled 2020-09-03: qty 30

## 2020-09-03 MED ORDER — ROPIVACAINE HCL 5 MG/ML IJ SOLN
4.0000 mL | Freq: Once | INTRAMUSCULAR | Status: AC
Start: 2020-09-03 — End: 2020-09-03
  Administered 2020-09-03: 4 mL

## 2020-09-03 NOTE — Nursing Progress Note (Signed)
Discharge instructions reviewed with the patient by this RN. Patient verbalizes understanding and has no questions at this time. Paper copies provided.

## 2020-09-03 NOTE — Discharge Instructions (Signed)
Department of Radiology  General Post Biopsy  Patient Discharge Instructions    Instructions:  . The results of your test will be sent to the doctor who referred you here for the procedure.  This usually occurs within 2-3 days.  . The biopsy site will heal in a few days. You may take a shower but no tub baths, pool activity, hot tub, or Jacuzzi.  May remove gauze dressing in 24 hours and leave open to air.  Cleanse with soap and water daily.    . If the biopsy site becomes red, has drainage, or you develop a fever, contact your doctor.  . Do NOT take Aspirin, ibuprofen, naprosyn, or other NSAIDs  for 1 week following procedure, unless otherwise directed by your doctor.  . No strenuous activity or heavy lifting for *** days  . You may resume your usual diet today  . You may return to work tomorrow if you have a desk job OR *** days if you have a strenuous job.  . Your IV site may feel sore for 24 hours.  You can place cool compresses for comfort at site for first 24 hrs and then heat as needed after that.    Instructions due to Conscious Sedation:  . Do NOT participate in activities where you could become injured for the next 24 hours or until you feel normal again.  For example do not:  drive, operate heavy machinery, cook, or use power tools.  . Do not make important decisions or sign legal documents for the next 24 hours.  . Only take over-the-counter or prescription medications for pain, discomfort or fever, as directed.  If pain medications have been prescribed for you, ask your healthcare team how soon it is safe to take.  . You should not drink alcohol, take sleeping pills, or medications that cause drowsiness for 24 hours.    Seek medical attention for the following:  . Develop a fever greater than 100 F or chills  . Increased or continued pain from the biopsy site  . Redness, swelling, warmth, or bleeding or other drainage from the site  . Light headedness or dizziness  . Nausea and /or  vomiting  . Shortness of breath or difficulty breathing    If you have any questions, please call your doctor or the Radiology Department at [phone].

## 2020-11-19 ENCOUNTER — Encounter: Payer: Self-pay | Admitting: Physician Assistant

## 2020-11-19 DIAGNOSIS — M7121 Synovial cyst of popliteal space [Baker], right knee: Secondary | ICD-10-CM

## 2020-11-24 ENCOUNTER — Ambulatory Visit
Admission: RE | Admit: 2020-11-24 | Discharge: 2020-11-24 | Disposition: A | Discharge status: Home / Self Care | Payer: Non-veteran care | Source: Ambulatory Visit | Attending: Physician Assistant | Admitting: Physician Assistant

## 2020-11-24 DIAGNOSIS — M7121 Synovial cyst of popliteal space [Baker], right knee: Secondary | ICD-10-CM | POA: Insufficient documentation

## 2020-11-24 MED ORDER — METHYLPREDNISOLONE ACETATE 40 MG/ML IJ SUSP
INTRAMUSCULAR | Status: AC
Start: 2020-11-24 — End: ?
  Filled 2020-11-24: qty 1

## 2020-11-24 MED ORDER — ROPIVACAINE HCL 5 MG/ML IJ SOLN
INTRAMUSCULAR | Status: AC
Start: 2020-11-24 — End: ?
  Filled 2020-11-24: qty 30

## 2020-11-24 MED ORDER — ROPIVACAINE HCL 5 MG/ML IJ SOLN
1.0000 mL | Freq: Once | INTRAMUSCULAR | Status: AC
Start: 2020-11-24 — End: 2020-11-24
  Administered 2020-11-24: 1 mL

## 2020-11-24 MED ORDER — METHYLPREDNISOLONE ACETATE 40 MG/ML IJ SUSP
40.0000 mg | Freq: Once | INTRAMUSCULAR | Status: AC
Start: 2020-11-24 — End: 2020-11-24
  Administered 2020-11-24: 40 mg via INTRA_ARTICULAR

## 2020-11-24 NOTE — Progress Notes (Signed)
Discharge instructions verbally reviewed with patient, he verbalized understanding and denied any further questions, printed copy declined by patient.

## 2021-01-21 ENCOUNTER — Encounter: Payer: Self-pay | Admitting: Physician Assistant

## 2021-01-21 DIAGNOSIS — M7121 Synovial cyst of popliteal space [Baker], right knee: Secondary | ICD-10-CM

## 2021-02-01 ENCOUNTER — Ambulatory Visit
Admission: RE | Admit: 2021-02-01 | Discharge: 2021-02-01 | Disposition: A | Discharge status: Home / Self Care | Payer: Non-veteran care | Source: Ambulatory Visit | Attending: Physician Assistant | Admitting: Physician Assistant

## 2021-02-01 MED ORDER — METHYLPREDNISOLONE ACETATE 40 MG/ML IJ SUSP
INTRAMUSCULAR | Status: AC
Start: 2021-02-01 — End: ?
  Filled 2021-02-01: qty 1

## 2021-02-18 DIAGNOSIS — M545 Low back pain, unspecified: Secondary | ICD-10-CM

## 2021-02-23 ENCOUNTER — Ambulatory Visit
Admission: RE | Admit: 2021-02-23 | Discharge: 2021-02-23 | Disposition: A | Discharge status: Home / Self Care | Payer: Non-veteran care | Source: Ambulatory Visit | Attending: Neurology | Admitting: Neurology

## 2021-02-23 DIAGNOSIS — M419 Scoliosis, unspecified: Secondary | ICD-10-CM | POA: Insufficient documentation

## 2021-02-23 DIAGNOSIS — N281 Cyst of kidney, acquired: Secondary | ICD-10-CM | POA: Insufficient documentation

## 2021-02-23 DIAGNOSIS — M545 Low back pain, unspecified: Secondary | ICD-10-CM | POA: Insufficient documentation

## 2021-02-23 DIAGNOSIS — M4316 Spondylolisthesis, lumbar region: Secondary | ICD-10-CM | POA: Insufficient documentation

## 2021-02-23 DIAGNOSIS — M48061 Spinal stenosis, lumbar region without neurogenic claudication: Secondary | ICD-10-CM | POA: Insufficient documentation

## 2021-03-23 ENCOUNTER — Other Ambulatory Visit: Payer: Self-pay

## 2021-03-23 ENCOUNTER — Ambulatory Visit (INDEPENDENT_AMBULATORY_CARE_PROVIDER_SITE_OTHER): Payer: Medicare (Managed Care) | Admitting: Family Medicine

## 2021-03-23 ENCOUNTER — Ambulatory Visit: Payer: Medicare (Managed Care) | Attending: Family Medicine | Admitting: Family Medicine

## 2021-03-23 ENCOUNTER — Encounter (INDEPENDENT_AMBULATORY_CARE_PROVIDER_SITE_OTHER): Payer: Self-pay | Admitting: Family Medicine

## 2021-03-23 VITALS — BP 153/93 | HR 126 | Temp 103.0°F | Ht 70.0 in | Wt 245.0 lb

## 2021-03-23 DIAGNOSIS — R059 Cough, unspecified: Secondary | ICD-10-CM

## 2021-03-23 DIAGNOSIS — U071 COVID-19: Secondary | ICD-10-CM

## 2021-03-23 DIAGNOSIS — R509 Fever, unspecified: Secondary | ICD-10-CM

## 2021-03-23 LAB — POC COVID-19, FLU A/B, RSV RAPID BY PCR (RESULTS)
INFLUENZA VIRUS A, PCR 4PLEX, POC: NEGATIVE
INFLUENZA VIRUS B, PCR 4PLEX, POC: NEGATIVE
RSV, PCR 4PLEX, POC: NEGATIVE
SARS-COV-2, POC: POSITIVE — AB

## 2021-03-23 MED ORDER — MOLNUPIRAVIR 200 MG CAPSULE (EUA)
ORAL_CAPSULE | ORAL | 0 refills | Status: AC
Start: 2021-03-23 — End: ?

## 2021-03-23 NOTE — Addendum Note (Signed)
Addended by: Audree Camel on: 03/23/2021 11:48 AM     Modules accepted: Level of Service

## 2021-03-23 NOTE — Patient Instructions (Signed)
Coronavirus Disease 2019 (COVID-19): Overview  Coronavirus disease 2019 (COVID-19) is an illness that infects the lungs. It's caused by a type of coronavirus called SARS-CoV-2. There are many types of coronaviruses. They are a very common cause of colds and bronchitis. They can cause a lung infection called pneumonia. Symptoms can range from mild to severe. Some people have no symptoms. These viruses are also found in some animals.   The virus that causes COVID-19 changes (mutates) all the time. This is what all viruses do. It leads to different versions of a virus. These are called variants. COVID-19 variants may spread more easily from person to person. They may cause milder or more severe symptoms.   How the virus spreads is not yet fully known. It seems to spread and infect people easily. Some people may not know how they were infected. The virus may spread through droplets of fluid that a person coughs or sneezes into the air. It may be spread if you touch a surface with the virus on it and then touch your eyes, nose, or mouth. Handles, knobs, and objects may have the virus on them.      To help prevent spreading the infection, wash your hands often, or use an alcohol-based hand sanitizer.   For the latest information from the CDC:   Go to the [facility] website   Call 800-CDC-INFO 804-064-8596)  What are the symptoms of COVID-19?  Some people have no symptoms. Some have mild symptoms. And other people may have severe symptoms. Types of symptoms can vary from person to person. They may appear 2 to 14 days after contact with the virus. They can include:    Fever   Chills   Coughing   Trouble breathing or feeling short of breath   Sore throat   Stuffy or runny nose   Headache   Body aches   Tiredness   Nausea, vomiting, diarrhea, or abdominal pain   New loss of sense of smell or taste  Check your symptoms with the CDC's Coronavirus Self-Checker.   What are possible complications of [id number]-95?  The virus  can cause an infection in both lungs called pneumonia. In some cases, this can lead to death. Experts are still learning more about LIYIY-26 complications. More may be linked to the virus over time.   Some people are at higher risk for complications. This includes:    Older adults   People with heart or lung disease   People with diabetes or kidney disease   People with health conditions that suppress the immune system   People who take medicines that suppress the immune system  Rarely, a child may have a severe complication. This is called multisystem inflammatory syndrome in children (MIS-C). MIS-C seems to be like Kawasaki disease. This is a rare illness that causes inflammation of blood vessels and body organs. It's not yet known if MIS-C happens only in children. Experts don't know if adults are also at risk. It's also not known if it's related to COVID-19. Many children with MIS-C have tested positive for COVID-19. But not all have tested positive. Experts continue to study MIS-C.   How is COVID-19 diagnosed?  Your healthcare provider will ask:   What symptoms you have   Where you live   If you've traveled recently   If you've had contact with sick people  You may have 1 of these tests for COVID-19:   Viral (molecular) test. You may also hear this called an RT-PCR  test. Viral tests are very accurate. A viral test looks for the DNA of the SARS-CoV-2 virus. A viral test can also find COVID-19 variants. There are a few ways to do this. A swab may be wiped inside your nose or throat. Or a long swab may be put into your nose down to the back of your throat. Or a sample of your saliva may be taken. Your test results may be back in about 30 minutes. This depends on the type of test. Some tests must be sent to a lab. These can take several days for the results. Home test kits are now available. Some of these need a prescription. If you use a home kit, follow the instructions in the kit closely. Some kits  show results quickly at home. Others must be sent to a lab for the results.   Antigen test. This can find proteins from the SARS-CoV-2 virus. A swab may be wiped inside your nose or throat. Or a long swab may be put into your nose down to the back of your throat. Some results are back within 1 hour. This depends on the type of test. Positive results are very accurate. But false positive results can happen. This is more common in places where not many people have the virus. Antigen tests are more likely to miss a COVID-19 infection than a viral (molecular) test. If your antigen test is negative but you have symptoms of COVID-19, you may need to have a viral test.  If your provider thinks or confirms that you have COVID-19, you may have other tests. These tests may include:    Antibody blood test. This type of test can show if you were infected with the virus in the past. It shows antibodies in the blood that give some immunity. The accuracy and availability of these tests vary. An antibody test may not be able to show if you have an infection right now. This is because it can take up to a few weeks for your body to make antibodies.   Sputum culture. If you have a wet cough, you may be asked to cough up a small sample of mucus (sputum) from your lungs. This is tested for the virus. It may be tested for pneumonia.   Imaging tests. You may have a chest X-ray or CT scan.  Can you get COVID-19 again?  At this time, it's not clear if people can get COVID-19 more than once. The CDC notes that if a person has recovered from COVID-19 and is tested again within 3 months, they may still have low levels of the virus in their body. This means they test positive for COVID-19, but are not spreading it. Experts just don't yet know how long immunity lasts after you have the virus. They don't know if you can get COVID-19 again.   Vaccines for COVID-19  The FDA has approved several vaccines to help prevent COVID-19. Getting a  vaccine is important. It can help to prevent the spread of COVID-19 and its variants. It can help reduce the severity of COVID-19 if you get it. This can stop you from getting seriously ill. It can keep you from needing to go to the hospital. One vaccine has been approved for people as young as 30. Pregnant or breastfeeding people can be vaccinated. Ask your healthcare provider which vaccine may be best for you.   The vaccines are given as a shot (injection). This is most often done in a muscle in  the upper arm. There are 1-dose and 2-dose vaccines. For the 2-dose vaccine, the second dose is given several weeks after the first.   How is COVID-19 treated?  The most proven treatments right now are those to help your body while it fights the virus. This is known as supportive care. It includes:    Getting rest. This helps your body fight the illness.   Drinking fluids. Try to drink 6 to 8 glasses of fluids every day. Ask your provider which drinks are best for you. Don't have drinks with caffeine or alcohol.   Taking over-the-counter (OTC) medicine.  These are used to help ease pain and reduce fever. Ask your provider which OTC medicine is safe for you to use.  For severe illness, you may need to stay in the hospital. Your care may include:    IV (intravenous) fluids. These are given through a vein. This helps to replace fluids in your body.   Oxygen. You may be given supplemental oxygen. Or you may be put on a breathing machine (ventilator). This is done so you get enough oxygen in your body.   Prone positioning. Your healthcare team may regularly turn you on your stomach. This is called prone positioning. It helps increase the amount of oxygen you get to your lungs. Follow their instructions on position changes while you're in the hospital. Also follow their advice on the best positions to help your breathing once you go home.   Remdesivir. This is an antiviral medicine. The FDA has approved it for use on  COVID-19. It works by stopping the spread of the SARS-CoV-2 virus in the body. It's approved only for people who are in the hospital. It's for people 12 years and older who weigh at least 88 pounds (40 kgs). In some cases, it may also be used for people younger than 12 years or who weigh less than 88 pounds (40 kgs).  Experts are researching other types of treatment. These are still being tested. They are not widely used. These include:    COVID-19 convalescent plasma. Plasma is the liquid part of blood. People who had COVID-19 may be asked to donate plasma. This is called COVID-19 convalescent plasma donation. The plasma may have antibodies that can help fight COVID-19 in people who are very ill with it. Experts don't know how well the plasma will work. They continue to research it. The FDA has approved it for emergency use in some people with severe COVID-19. Ask your provider if you qualify to donate.   Monoclonal antibody therapy. The FDA approved this for emergency use in some people. They must have a positive COVID-19 viral test and have mild to moderate symptoms. They can't be in the hospital. It's approved for people 12 years and older who weigh at least 88 pounds (40 kgs) and are at high risk for severe COVID-19 and a hospital stay. This includes people who are 65 years and older and people with some chronic conditions. This therapy is not approved for people who are in the hospital with COVID-19 or need oxygen. Your healthcare team will tell you if you qualify.  Are you at risk for COVID-19?  You are at risk for COVID-19 if any of these apply to you:   You live in an area with cases of COVID-19   You traveled to an area with cases of COVID-19   You had close contact with someone who had COVID-19  Close contact means being within 6 feet.  This contact happens for 15 minutes or more. It may be short times of contact that add up to at least 15 minutes over a 24-hour period.   Keep in mind that COVID-19  may be spread by people who do not show symptoms.   Last updated: 03/12/2020   StayWell last reviewed this educational content on 09/12/2018   2000-2021 [employer]. All rights reserved. This information is not intended as a substitute for professional medical care. Always follow your healthcare professional's instructions.        [facility]  [address], [place]  [place], [place] [address]  Phone: [phone]  Fax: [phone]             Open Daily 8:00am - 8:00pm, except Sundays 12pm-8pm         ~ Closed Thanksgiving and Christmas Day     Attending Caregiver: Maura Crandall, MD      Today's orders:   Orders Placed This Encounter   . POC COVID-19, FLU A/B, RSV RAPID BY PCR (RESULTS)   . molnupiravir 200 mg Oral Capsule        Prescription(s) E-Rx to:  [id number] - [place], [place] - [address] AT [place]    ________________________________________________________________________  Short Term Disability and [facility] does NOT provide assistance with any disability applications.  If you feel your medical condition requires you to be on disability, you will need to follow up with  Your primary care physician or a specialist.  We apologize for any inconvenience.    For Medication Prescribed by [facility]:  As an Urgent Care facility, our clinic does NOT offer prescription refills over the telephone.    If you need more of the medication one of our medical providers prescribed, you will  Either need to be re-evaluated by [place] or see your primary care physician.    ________________________________________________________________________      It is very important that we have a phone number that is the single best way to contact you in the event that we become aware of important clinical information or concerns after your discharge.  If the phone number you provided at registration is NOT this number  you should inform staff and registration prior to leaving.      Your treatment and evaluation today was focused on identifying and treating potentially emergent conditions based on your presenting signs, symptoms, and history.  The resulting initial clinical impression and treatment plan is not intended to be definitive or a substitute for a full physical examination and evaluation by your primary care provider.  If your symptoms persist, worsen, or you develop any new or concerning symptoms, you need to be evaluated.      If you received x-rays during your visit, be aware that the final and formal interpretation of those films by a radiologist may occur after your discharge.  If there is a significant discrepancy identified after your discharge, we will contact you at the telephone number provided at registration.      If you received a pelvic exam, you may have cultures pending for sexually transmitted diseases.  Positive cultures are reported to the [place] Department of Health as required by state law.  You should be contacted if you cultures are positive.  We will not contact you if they are negative.  You did NOT receive a PAP  smear (the screening test for cervical).  This specific test for women is best performed by your gynecologist or primary care provider when indicated.      If you are over 93 year old, we cannot discuss your personal health information with a parent, spouse, family member, or anyone else without your express consent.  This does not include those who have legitimate access to your records and information to assist in your care under the provisions of HIPAA [facility] Portability and Accountability Act) law, or those to whom you have previously given express written consent to do so, such a legal guardian or Power of [place].      You may have received medication that may cause you to feel drowsy and/or light headed for several hours.  You may even experience some amnesia of your stay.   You should avoid operating a motor vehicle or performing any activity requiring complete alertness or coordination until you feel fully awake (approximately 24-48 hours).  Avoid alcoholic beverages.  You may also have a dry mouth for several hours.  This is a normal side effect and will disappear as the effects of the medication wear off.      Instructions discussed with patient upon discharge by clinical staff with all questions answered.  Please call [facility] [phone]) if any further questions.  Go immediately to the emergency department if any concern or worsening symptoms.      Audree Camel, MD 03/23/2021, 11:25

## 2021-03-23 NOTE — Nursing Note (Signed)
BP (!) 153/93   Pulse (!) 126   Temp (!) 39.4 C (103 F) (Tympanic)   Ht 1.778 m (5\' 10" )   Wt 111 kg (245 lb)   SpO2 95%   BMI 35.15 kg/m     , RN  03/23/2021, 10:17

## 2021-03-23 NOTE — Progress Notes (Signed)
[facility]  [address] [place] [address]  Dept: [phone]      Patient name .Justin Jenkins  Date of birth:[birth year]  Date of service:03/23/21     CHIEF COMPLAINT:Cough (Symptoms since yesterday), Generalized Body Aches, and Shortness of Breath    Justin Jenkins is a 75 y.o. presents to the urgent care today complaining of lower back pain, cough, shortness of that, generalized body aches, fever that started the day prior to arrival.    Patient has taken Tylenol for the fever without much relief.    Denies chest pain, chest tightness, loss of smell or taste, wheezing.    Patient has had a home COVID test 1 hour prior to arrival which was positive and requests prescription for antiviral COVID-19 medication.  Exposure to COVID -19:  No  Vaccinated for COVID-19 :  Both doses of Moderna.  Over-the-counter medication taken for symptom relief:  Tylenol    Allergies as of 03/23/2021   . (No Known Allergies)     PMH   Past Medical History:   Diagnosis Date   . Hypertension    Hyperlipidemia       Social OZ:HYQMVHQ denies smoking.  Review of Systems   Pertinent items are noted in HPI. All pertinent positives and negatives noted in the HPI  Constitutional:No fever  Neuro: No dizziness  ENT:  No sore throat, No rhinorrhea, No sinus pains.  Respiratory: no  cough, No shortness of breath, No wheezing  Cardiovascular: no chest pain   Muskuloskeletal: + myalgias.  GI.no nausea,vomiting.  Skin: no rashes.    Objective:  Blood pressure (!) 153/93, pulse (!) 126, temperature (!) 39.4 C (103 F), temperature source Tympanic, height 1.778 m (5\' 10" ), weight 111 kg (245 lb), SpO2 95 %.    Pt in NAD,is able to talk in full sentences.  HEENT: mucous membranes moist. Post nasal drip noted,No sinus tenderness bilaterally.no tonsillar enlargement,erythema or exudate ,no cervical lymphadenpathy ,  bilateral serous effusion in ears without erythema.  Heart: Regular rate and  rhythm, no murmur.   Lungs are clear to auscultation bilaterally,no rales,rhonchi or wheezing noted bilaterally,good air entry bilaterally.  Back nontender, straight leg raising test was negative, no bruising or ecchymoses noted on the skin in the back.    LABS:COVID test, RSV, flu test ordered today.    Assessment and plan:    COVID-19    COVID-19 PCR was positive in the urgent care today, flu, RSV test was negative in the urgent care.  Patient was informed of the results today.    Patient opted for antiviral medication and given prescription for Lagevrio after discussion with the patient regarding had affects an emergency use authorization status of the medication.    He is not aware of his cholesterol medication that he is taking ,has not had any CMP done recently.  Avoided Paxlovid due to that.  may return to work/school after day 5 of illness if completely asymptomatic at that time.   Must strictly mask for additional 5 days if back to work/school.    If symptomatic, will need to quarantine 10 days from the day of onset of symptoms.  Advised conservative management and symptomatic care, acetaminophen p.r.n. with food.  Advised to followup with primary care in 1week  if symptoms do not get better or earlier if symptoms worsen or change in any way and is concerned in any way ,pt needs to go to  the emergency department for further evaluation and workup.  Patient  agrees with above plan and verbalized understanding. All questions answered.       Portions of this note may be dictated using voice recognition software . Variances in spelling and vocabulary are possible and unintentional. Not all errors are caught/corrected. Please notify the [facility] if any discrepancies are noted or if the meaning of any statement is not clear.   Audree Camel, MD  03/23/2021, 11:25

## 2024-07-10 ENCOUNTER — Telehealth (INDEPENDENT_AMBULATORY_CARE_PROVIDER_SITE_OTHER): Payer: Self-pay | Admitting: Student in an Organized Health Care Education/Training Program

## 2024-07-10 NOTE — Telephone Encounter (Signed)
 Message from [place] [facility] sent at 05/22/2024  4:42 PM EDT    Summary: New Appointment NPV neurology    Copied From CRM [phone].  Estala, Odis (Self) called to schedule an appointment.      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
